# Patient Record
Sex: Male | Born: 1975 | Race: White | Hispanic: No | Marital: Married | Smoking: Never smoker
Health system: Southern US, Community
[De-identification: ages and names within clinical notes are randomized; demographics above are authoritative.]

---

## 2014-08-24 ENCOUNTER — Ambulatory Visit: Payer: Self-pay | Attending: Family Medicine

## 2014-09-09 ENCOUNTER — Emergency Department (HOSPITAL_COMMUNITY)
Admission: EM | Admit: 2014-09-09 | Discharge: 2014-09-09 | Disposition: A | Discharge status: Home / Self Care | Payer: Self-pay | Source: Home / Self Care | Attending: Family Medicine | Admitting: Family Medicine

## 2014-09-09 ENCOUNTER — Encounter (HOSPITAL_COMMUNITY): Payer: Self-pay | Admitting: *Deleted

## 2014-09-09 DIAGNOSIS — K0889 Other specified disorders of teeth and supporting structures: Secondary | ICD-10-CM

## 2014-09-09 DIAGNOSIS — K088 Other specified disorders of teeth and supporting structures: Secondary | ICD-10-CM

## 2014-09-09 MED ORDER — AMOXICILLIN 500 MG PO CAPS: 500.0000 mg | ORAL_CAPSULE | Freq: Three times a day (TID) | ORAL | Status: DC

## 2014-09-09 NOTE — Discharge Instructions (Signed)
Dental Pain °A tooth ache may be caused by cavities (tooth decay). Cavities expose the nerve of the tooth to air and hot or cold temperatures. It may come from an infection or abscess (also called a boil or furuncle) around your tooth. It is also often caused by dental caries (tooth decay). This causes the pain you are having. °DIAGNOSIS  °Your caregiver can diagnose this problem by exam. °TREATMENT  °· If caused by an infection, it may be treated with medications which kill germs (antibiotics) and pain medications as prescribed by your caregiver. Take medications as directed. °· Only take over-the-counter or prescription medicines for pain, discomfort, or fever as directed by your caregiver. °· Whether the tooth ache today is caused by infection or dental disease, you should see your dentist as soon as possible for further care. °SEEK MEDICAL CARE IF: °The exam and treatment you received today has been provided on an emergency basis only. This is not a substitute for complete medical or dental care. If your problem worsens or new problems (symptoms) appear, and you are unable to meet with your dentist, call or return to this location. °SEEK IMMEDIATE MEDICAL CARE IF:  °· You have a fever. °· You develop redness and swelling of your face, jaw, or neck. °· You are unable to open your mouth. °· You have severe pain uncontrolled by pain medicine. °MAKE SURE YOU:  °· Understand these instructions. °· Will watch your condition. °· Will get help right away if you are not doing well or get worse. °Document Released: 02/12/2005 Document Revised: 05/07/2011 Document Reviewed: 10/01/2007 °ExitCare® Patient Information ©2015 [employer]. This information is not intended to replace advice given to you by your health care provider. Make sure you discuss any questions you have with your health care provider. ° °Emergency Department Resource Guide °1) Find a Doctor and Pay Out of Pocket °Although you won't have to find out who  is covered by your insurance plan, it is a good idea to ask around and get recommendations. You will then need to call the office and see if the doctor you have chosen will accept you as a new patient and what types of options they offer for patients who are self-pay. Some doctors offer discounts or will set up payment plans for their patients who do not have insurance, but you will need to ask so you aren't surprised when you get to your appointment. ° °2) Contact Your Local Health Department °Not all health departments have doctors that can see patients for sick visits, but many do, so it is worth a call to see if yours does. If you don't know where your local health department is, you can check in your phone book. The CDC also has a tool to help you locate your state's health department, and many state websites also have listings of all of their local health departments. ° °3) Find a Walk-in Clinic °If your illness is not likely to be very severe or complicated, you may want to try a walk in clinic. These are popping up all over the country in pharmacies, drugstores, and shopping centers. They're usually staffed by nurse practitioners or physician assistants that have been trained to treat common illnesses and complaints. They're usually fairly quick and inexpensive. However, if you have serious medical issues or chronic medical problems, these are probably not your best option. ° °No Primary Care Doctor: °- Call [facility] at  832-8000 - they can help you locate a primary   care doctor that  accepts your insurance, provides certain services, etc. °- Physician Referral Service- 1-800-533-3463 ° °Chronic Pain Problems: °Organization         Address  Phone   Notes  °[facility]  (336) 297-2271 Patients need to be referred by their primary care doctor.  ° °Medication Assistance: °Organization         Address  Phone   Notes  °[place] [facility] [address]  [address] °[place], [place] [address] (336) 641-8030 --Must be a resident of [place] °-- Must have NO insurance coverage whatsoever (no Medicaid/ Medicare, etc.) °-- The pt. MUST have a primary care doctor that directs their care regularly and follows them in the community °  °[facility]  (866) 331-1348   °[employer]  (888) 892-1162   ° °Agencies that provide inexpensive medical care: °Organization         Address  Phone   Notes  °[facility]  (336) 832-8035   °[facility]    (336) 832-7272   °[facility] [address] °[place], [place] [address] (336) 832-4777   °[facility] [address], °[place] (336) 271-4999   °[facility]    (336) 373-0678   °[facility]    (336) 272-1050   °[facility] ° [address], [place] Phone:  (336) 832-4444, Fax:  (336) 832-4440 Hours of Operation:  9 am - 6 pm, M-F.  Also accepts Medicaid/Medicare and self-pay.  °[facility] ° [address], [place] Phone: (336) 832-3150, Fax: (336) 832-3151. Hours of Operation:  8:30 am - 5:30 pm, M-F.  Also accepts Medicaid and self-pay.  °[facility] [place] [address], [place] Phone: (336) 878-6027   °[facility] [address], [place], [place] (336)723-1848, Ext. 123 Mondays & Thursdays: 7-9 AM.  First 15 patients are seen on a first come, first serve basis. °  ° °Medicaid-accepting [place] Providers: ° °Organization         Address  Phone   Notes  °[facility] [address], [place] (336) 641-2100 Also accepts self-pay patients.  °[facility] [address], [place] ° (336) 856-9996   °[facility] [address], [place] (336) 288-8857   °[facility] [address] [place] [address], [place] (336) 299-7000   °Veita Bland [address], [place]  ° (336) 373-1557 Only accepts [place] Access Medicaid patients after they have their name applied to their card.  ° °Self-Pay (no insurance) in [place]: ° °Organization         Address  Phone   Notes  °Sickle Cell Patients, [facility] [address], [place] (336) 832-1970   °[facility] [address], [place] (336) 832-4400   °[facility] ° [address], [place] (336) 992-4800   °[facility]/Dr. Osei-Bonsu ° [address] [place] [address], [place] or [address], [place] (336) 841-8500 Phone number for both [place] and [place] locations is the same.  °[facility] [address], [place] (336) 299-0000   °[facility] [address] [place] [address], [place] or [address] (336) 852-7530 °(336) 878-2260   °  [facility] [address], [place] (336) 350-1642, phone; (336) 294-5005, fax Sees patients 1st and 3rd Saturday of every month.  Must not qualify for public or private insurance (i.e. Medicaid, Medicare, [place] Health Choice, Veterans' Benefits) • Household income should be no more than 200% of the poverty level •The clinic cannot treat you if you are pregnant or think you are pregnant • Sexually transmitted diseases are not treated at the clinic.  ° ° °Dental Care: °Organization         Address  Phone  Notes  °[facility] [address], [place] (336) 641-6152 Accepts children up to age 21 who are enrolled in Medicaid or [place] Health Choice; pregnant women with a Medicaid card; and children who have applied for Medicaid or [place] Health Choice, but were declined, whose parents can pay a reduced fee at time of service.  °[facility] [place]  [address], [place] (336) 641-7733 Accepts children up to age 21 who are  enrolled in Medicaid or [place] Health Choice; pregnant women with a Medicaid card; and children who have applied for Medicaid or [place] Health Choice, but were declined, whose parents can pay a reduced fee at time of service.  °[facility] ° [address], [place] (336) 641-4533 Patients are seen by appointment only. Walk-ins are not accepted. [facility] will see patients 18 years of age and older. °Monday - Tuesday (8am-5pm) °Most Wednesdays (8:30-5pm) °$30 per visit, cash only  °[facility] ° [address], [place] (336) 641-4533 Patients are seen by appointment only. Walk-ins are not accepted. [facility] will see patients 18 years of age and older. °One Wednesday Evening (Monthly: Volunteer Based).  $30 per visit, cash only  °[facility]  (919) 537-3737 for adults; Children under age 4, call Graduate Pediatric Dentistry at (919) [phone]. Children aged 4-14, please call (919) [phone] to request a pediatric application. ° Dental services are provided in all areas of dental care including fillings, crowns and bridges, complete and partial dentures, implants, gum treatment, root canals, and extractions. Preventive care is also provided. Treatment is provided to both adults and children. °Patients are selected via a lottery and there is often a waiting list. °  °[facility] [address], ° ° (336) 763-8833 [email or web address].[email or web address] °  °[facility] [address], [place], [place] (336)723-1848, Ext. 123 Second and Fourth Thursday of each month, opens at 6:30 AM; Clinic ends at 9 AM.  Patients are seen on a first-come first-served basis, and a limited number are seen during each clinic.  ° °[facility] ° [address], [place], [place] (336) 723-7904   Eligibility Requirements °You must have lived in [place], [place], or [place] counties for at least the last three months. °  You  cannot be eligible for state or federal sponsored healthcare insurance, including Veterans Administration, Medicaid, or Medicare. °  You generally cannot be eligible for healthcare insurance through your employer.  °  How to apply: °Eligibility screenings are held every Tuesday and Wednesday afternoon from 1:00 pm until 4:00 pm. You do not need an appointment for the interview!  °[facility] [address], [place], [place] 336-631-2330   °[facility]  336-342-8273   °[facility]  336-703-3100   °[facility]  336-570-6415   ° °Behavioral Health Resources in the Community: °Intensive Outpatient Programs °Organization         Address  Phone  Notes  °[facility] [address], [place], [place] 336-878-6098   °[facility] [address], [place], [place] 336-832-9800   °[facility] [address], [place], [place] ° 336-882-2125   °[facility] [address],  °[place], [place] 1-800-853-5163 or 336-641-4981   °Substance Abuse Resources °Organization         Address  Phone  Notes  °[facility]  336-882-2125   °[facility]  336-784-9470   °[facility]  336-285-9073   °[facility]  336-845-3988   °Residential & Outpatient Substance Abuse Program  1-800-659-3381   °Psychological Services °Organization         Address  Phone  Notes  °[facility]  336- 832-9600   °[facility]  336- 378-7881   °[facility] [address], [place] 1-800-853-5163 or 336-641-4981   ° °Mobile Crisis Teams °Organization         Address  Phone  Notes  °[facility], Mobile Crisis Care Unit  1-877-626-1772   °Assertive °[facility] ° [address] [place], [place] 336-834-9664   °Sharon DeEsch [address] °[place] 336-554-5454   ° °Self-Help/Support  Groups °Organization         Address  Phone             Notes  °[facility] - variety of support groups  336- 373-1402 Call for more information  °Narcotics Anonymous (NA), [facility] [address], °[place]  2 meetings at this location  ° °Residential Treatment Programs °Organization         Address  Phone  Notes  °[facility] [address],    °[place]  1-866-801-8205   °[facility] ° [address], [place], [place] 704-293-8524   °[facility] [address], [place] 336-845-3988 Admissions: 8am-3pm M-F  °[facility] [address]    °[place], [place] 336-841-1104   °[facility] [address], [place], [place] 336-379-7146   °[facility] [address]  °[place], [place] 336-285-9073   °[facility] - Intensive Outpatient [address], [place], [place] 336-852-3033   °[facility] [address]  °[place], [place] 1-877-615-2722 or 336-784-9470   °[facility] [address] [place], [place] 336-227-7417 Accepts Medicaid  °[facility] [address]  °[place] 1-800-659-3381 Substance Abuse/Addiction Treatment  ° °[place] Behavioral Health Resources °Organization         Address  Phone  Notes  °[facility]  (888) 581-9988   °Julie Brannon, PhD [address] [place], [place]   (336) 349-5553 or (336) 951-0000   °[facility]   [address] °[place], [place] (336) 349-4454   °[facility] [address], [place], [place] (336) 342-8316 Insurance/Medicaid/sponsorship through [facility]  °[facility] [address]                                    [place], [place] (336) 342-8316 Therapy/tele-psych/case  °[facility]   [address]  ° [place], [place] (336) 349-2233    °Dr. Arfeen  (336) 349-4544   °[facility] [place]  [place]  [employer] [facility] [place] [facility] 1) [address], [place] °2) [address], [place] °3)  [address], [place] (336) 349-3220 °(336) 342-7768 ° °(336) 342-8140   °[place] [facility] (336) 342-1394 or (336) 342-3537 (After Hours)    ° ° ° °

## 2014-09-09 NOTE — ED Provider Notes (Signed)
CSN: [id number]     Arrival date & time 09/09/14  1303 History   First MD Initiated Contact with Patient 09/09/14 1331     Chief Complaint  Patient presents with  . Oral Swelling   (Consider location/radiation/quality/duration/timing/severity/associated sxs/prior Treatment) Patient is a 39 y.o. male presenting with dental injury. The history is provided by the patient. No language interpreter was used.  Dental Injury This is a new problem. The problem occurs constantly. The problem has not changed since onset.Pertinent negatives include no chest pain. Nothing aggravates the symptoms. Nothing relieves the symptoms. He has tried nothing for the symptoms. The treatment provided no relief.   Pt complains of pain in his teeth History reviewed. No pertinent past medical history. History reviewed. No pertinent past surgical history. History reviewed. No pertinent family history. History  Substance Use Topics  . Smoking status: Never Smoker   . Smokeless tobacco: Not on file  . Alcohol Use: No    Review of Systems  Cardiovascular: Negative for chest pain.  All other systems reviewed and are negative.   Allergies  Review of patient's allergies indicates no known allergies.  Home Medications   Prior to Admission medications   Medication Sig Start Date End Date Taking? Authorizing Provider  amoxicillin (AMOXIL) 500 MG capsule Take 1 capsule (500 mg total) by mouth 3 (three) times daily. 09/09/14   Elson AreasLeslie K Sofia, PA-C   BP 127/81 mmHg  Pulse 89  Temp(Src) 98.2 F (36.8 C)  Resp 16  SpO2 99% Physical Exam  Constitutional: He is oriented to person, place, and time. He appears well-developed and well-nourished.  HENT:  Head: Normocephalic and atraumatic.  Several broken decayed areas, gum swollen around   Eyes: EOM are normal.  Neck: Normal range of motion.  Cardiovascular: Normal rate.   Pulmonary/Chest: Effort normal.  Abdominal: He exhibits no distension.  Musculoskeletal:  Normal range of motion.  Neurological: He is alert and oriented to person, place, and time.  Skin: Skin is warm.  Psychiatric: He has a normal mood and affect.  Nursing note and vitals reviewed.   ED Course  Procedures (including critical care time) Labs Review Labs Reviewed - No data to display  Imaging Review No results found.   MDM   1. Toothache    Resource guide amoxicillian    Elson AreasLeslie K Sofia, PA-C 09/09/14 1400  Lonia SkinnerLeslie K Franklin ParkSofia, [place] JerseyPA-C 09/09/14 1401

## 2014-09-09 NOTE — ED Notes (Signed)
Pt has  Bleeding  From  Gums               As   Well as   What  He    Describes  Is  A  Gap in  His  Teeth         With  The  Onset  Of   Symptoms           For  What  He  Describes  As  A  Long  Time     pt  Has  Not  Seen  A  Dentist  Yet  Due  To  Finances

## 2014-09-20 ENCOUNTER — Ambulatory Visit: Payer: Self-pay

## 2014-10-19 ENCOUNTER — Ambulatory Visit (INDEPENDENT_AMBULATORY_CARE_PROVIDER_SITE_OTHER): Payer: Self-pay | Admitting: Family Medicine

## 2014-10-19 ENCOUNTER — Encounter: Payer: Self-pay | Admitting: Family Medicine

## 2014-10-19 VITALS — BP 113/86 | HR 61 | Temp 98.2°F | Resp 16 | Ht 68.0 in | Wt 177.0 lb

## 2014-10-19 DIAGNOSIS — Z0000 Encounter for general adult medical examination without abnormal findings: Secondary | ICD-10-CM

## 2014-10-19 DIAGNOSIS — K069 Disorder of gingiva and edentulous alveolar ridge, unspecified: Secondary | ICD-10-CM

## 2014-10-19 LAB — CBC WITH DIFFERENTIAL/PLATELET
Basophils Absolute: 0 10*3/uL (ref 0.0–0.1)
Basophils Relative: 0 % (ref 0–1)
Eosinophils Absolute: 0.1 10*3/uL (ref 0.0–0.7)
Eosinophils Relative: 2 % (ref 0–5)
HCT: 46.6 % (ref 39.0–52.0)
Hemoglobin: 15.8 g/dL (ref 13.0–17.0)
Lymphocytes Relative: 37 % (ref 12–46)
Lymphs Abs: 2.2 10*3/uL (ref 0.7–4.0)
MCH: 29.7 pg (ref 26.0–34.0)
MCHC: 33.9 g/dL (ref 30.0–36.0)
MCV: 87.6 fL (ref 78.0–100.0)
MPV: 10 fL (ref 8.6–12.4)
Monocytes Absolute: 0.4 10*3/uL (ref 0.1–1.0)
Monocytes Relative: 7 % (ref 3–12)
Neutro Abs: 3.2 10*3/uL (ref 1.7–7.7)
Neutrophils Relative %: 54 % (ref 43–77)
Platelets: 170 10*3/uL (ref 150–400)
RBC: 5.32 MIL/uL (ref 4.22–5.81)
RDW: 14.4 % (ref 11.5–15.5)
WBC: 6 10*3/uL (ref 4.0–10.5)

## 2014-10-19 LAB — LIPID PANEL
Cholesterol: 170 mg/dL (ref 125–200)
HDL: 31 mg/dL — ABNORMAL LOW (ref >=40)
LDL Cholesterol: 102 mg/dL (ref <130)
Total CHOL/HDL Ratio: 5.5 Ratio — ABNORMAL HIGH (ref <=5.0)
Triglycerides: 183 mg/dL — ABNORMAL HIGH (ref <150)
VLDL: 37 mg/dL — ABNORMAL HIGH (ref <30)

## 2014-10-19 LAB — COMPLETE METABOLIC PANEL WITH GFR
ALT: 20 U/L (ref 9–46)
AST: 20 U/L (ref 10–40)
Albumin: 4.4 g/dL (ref 3.6–5.1)
Alkaline Phosphatase: 65 U/L (ref 40–115)
BUN: 14 mg/dL (ref 7–25)
CO2: 28 mmol/L (ref 20–31)
Calcium: 9.6 mg/dL (ref 8.6–10.3)
Chloride: 103 mmol/L (ref 98–110)
Creat: 0.92 mg/dL (ref 0.60–1.35)
GFR, Est African American: >89 mL/min (ref >=60)
GFR, Est Non African American: >89 mL/min (ref >=60)
Glucose, Bld: 83 mg/dL (ref 65–99)
Potassium: 4.5 mmol/L (ref 3.5–5.3)
Sodium: 137 mmol/L (ref 135–146)
Total Bilirubin: 0.7 mg/dL (ref 0.2–1.2)
Total Protein: 7.1 g/dL (ref 6.1–8.1)

## 2014-10-19 LAB — TSH: TSH: 1.444 u[IU]/mL (ref 0.350–4.500)

## 2014-10-19 MED ORDER — AMOXICILLIN 500 MG PO CAPS: 500.0000 mg | ORAL_CAPSULE | Freq: Three times a day (TID) | ORAL | Status: DC

## 2014-10-19 NOTE — Patient Instructions (Signed)
Someone will call you about your dental appointment. I would advise taking the antibiotic I am prescribing to clear up the infection in your gums.

## 2014-10-19 NOTE — Progress Notes (Signed)
Patient ID: Peter Dorsey, male   DOB: Jul 26, 1975, 39 y.o.   MRN: [id number]   Peter Dorsey, is a 39 y.o. male  [id number]  [id number]  DOB - 01/27/1976  CC: No chief complaint on file.      HPI: Peter Dorsey is a 39 y.o. male here to establish care. His only complaint today is of gum soreness and bleeding. He was in ED recently and was prescribed Amoxicillin but did not fill and take. He is requesting a referral to dentist with his orange card. He denies chronic illneses and takes no regular mediction.  No Known Allergies No past medical history on file. Current Outpatient Prescriptions on File Prior to Visit  Medication Sig Dispense Refill  . amoxicillin (AMOXIL) 500 MG capsule Take 1 capsule (500 mg total) by mouth 3 (three) times daily. 21 capsule 0   No current facility-administered medications on file prior to visit.   No family history on file. Social History   Social History  . Marital Status: Married    Spouse Name: N/A  . Number of Children: N/A  . Years of Education: N/A   Occupational History  . Not on file.   Social History Main Topics  . Smoking status: Never Smoker   . Smokeless tobacco: Not on file  . Alcohol Use: No  . Drug Use: Not on file  . Sexual Activity: Not on file   Other Topics Concern  . Not on file   Social History Narrative  . No narrative on file    Review of Systems: Constitutional: Negative for fever, chills, appetite change, weight loss,  fatigue. HENT: Negative for ear pain, ear discharge.nose bleeds Eyes: Negative for pain, discharge, redness, itching and visual disturbance. Neck: Negative for pain, stiffness Respiratory: Negative for cough, shortness of breath,   Cardiovascular: Negative for chest pain, palpitations and leg swelling. Gastrointestinal: Negative for abdominal distention, abdominal pain, nausea, vomiting, diarrhea, constipations Genitourinary: Negative for dysuria, urgency, frequency, hematuria, flank pain,   Musculoskeletal: Negative for back pain, joint pain, joint  swelling, arthralgia and gait problem.Negative for weakness. Neurological: Negative for dizziness, tremors, seizures, syncope,   light-headedness, numbness and headaches.  Hematological: Negative for easy bruising or bleeding Psychiatric/Behavioral: Negative for depression, anxiety, decreased concentration, confusion    Objective:   Filed Vitals:   10/19/14 0958  BP: 113/86  Pulse: 61  Temp: 98.2 F (36.8 C)  Resp: 16    Physical Exam: Constitutional: Patient appears well-developed and well-nourished. No distress. HENT: Normocephalic, atraumatic, External right and left ear normal. Oropharynx is clear and moist. . Gums on upper right are inflammed and swollen. Dentition is poor. Eyes: Conjunctivae and EOM are normal. PERRLA, no scleral icterus. Neck: Normal ROM. Neck supple. No lymphadenopathy, No thyromegaly. CVS: RRR, S1/S2 +, no murmurs, no gallops, no rubs Pulmonary: Effort and breath sounds normal, no stridor, rhonchi, wheezes, rales.  Abdominal: Soft. Normoactive BS,, no distension, tenderness, rebound or guarding.  Musculoskeletal: Normal range of motion. No edema and no tenderness.  Neuro: Alert.Normal muscle tone coordination. Non-focal Skin: Skin is warm and dry. No rash noted. Not diaphoretic. No erythema. No pallor. Psychiatric: Normal mood and affect. Behavior, judgment, thought content normal.  No results found for: WBC, HGB, HCT, MCV, PLT No results found for: CREATININE, BUN, NA, K, CL, CO2  No results found for: HGBA1C Lipid Panel  No results found for: CHOL, TRIG, HDL, CHOLHDL, VLDL, LDLCALC     Assessment and plan:   Visit to establish care. -I  have reviewed history provided by the patient and ED records -Cmet with GFR, CBC, lipid panel, TSH  Need for tetanus -patient declined  Gum disease -amoxicillin 500 mg, one po tid for 10 days -referral to dentist.  No Follow-up on file.  The  patient was given clear instructions to go to ER or return to medical center if symptoms don't improve, worsen or new problems develop. The patient verbalized understanding.      Henrietta Hoover, MSN, FNP-BC   10/19/2014, 10:02 AM

## 2015-01-24 ENCOUNTER — Ambulatory Visit: Payer: Self-pay | Admitting: Family Medicine

## 2015-02-03 ENCOUNTER — Ambulatory Visit (INDEPENDENT_AMBULATORY_CARE_PROVIDER_SITE_OTHER): Payer: 59 | Admitting: Family Medicine

## 2015-02-03 ENCOUNTER — Encounter: Payer: Self-pay | Admitting: Family Medicine

## 2015-02-03 VITALS — BP 112/77 | HR 68 | Temp 98.3°F | Ht 68.0 in | Wt 170.0 lb

## 2015-02-03 DIAGNOSIS — R0683 Snoring: Secondary | ICD-10-CM | POA: Diagnosis not present

## 2015-02-03 NOTE — Progress Notes (Signed)
Patient ID: Peter Dorsey, male   DOB: Sep 27, 1975, 39 y.o.   MRN: [id number]   Peter Dorsey, is a 39 y.o. male  [id number]  [id number]  DOB - [birth year]  CC:  Chief Complaint  Patient presents with  . Snoring    SOB  . Gum Disease       HPI: Peter Dorsey is a 39 y.o. male here c/o of severe snoring. He has recently tried head elevation to no avail. He also continues to complain of gum disease. He was referred and seen by dentist who says he has significant bone loss. He ask that we check for diabetes. He has been screen with negative results in the last couple of months. He complains of shortness of breath but it is difficult for me to determine if he is having trouble getting air through his nose or into his lungs. I think he is having a problem getting air through the nasal passages.  No Known Allergies History reviewed. No pertinent past medical history. Current Outpatient Prescriptions on File Prior to Visit  Medication Sig Dispense Refill  . amoxicillin (AMOXIL) 500 MG capsule Take 1 capsule (500 mg total) by mouth 3 (three) times daily. (Patient not taking: Reported on 02/03/2015) 30 capsule 0   No current facility-administered medications on file prior to visit.   Family History  Problem Relation Age of Onset  . Diabetes Mother   . Heart disease Father    Social History   Social History  . Marital Status: Married    Spouse Name: N/A  . Number of Children: N/A  . Years of Education: N/A   Occupational History  . Not on file.   Social History Main Topics  . Smoking status: Never Smoker   . Smokeless tobacco: Never Used  . Alcohol Use: No  . Drug Use: No  . Sexual Activity: Not on file   Other Topics Concern  . Not on file   Social History Narrative    Review of Systems: Constitutional: Negative for fever, chills, appetite change, weight loss,  Fatigue. Skin: Negative for rashes or lesions of concern. HENT: Negative for ear pain, ear discharge.nose  bleeds. Positive for nasal congestion. Eyes: Negative for pain, discharge, redness, itching and visual disturbance. Neck: Negative for pain, stiffness Respiratory: Negative for cough. Complains of shortness of breath and snoring. Cardiovascular: Negative for chest pain, palpitations and leg swelling. Gastrointestinal: Negative for abdominal pain, nausea, vomiting, diarrhea, constipations Genitourinary: Negative for dysuria, urgency, frequency, hematuria,  Musculoskeletal: Negative for back pain, joint pain, joint  swelling, and gait problem.Negative for weakness. Neurological: Negative for dizziness, tremors, seizures, syncope,   light-headedness, numbness and headaches.  Hematological: Negative for easy bruising or bleeding Psychiatric/Behavioral: Positive for mild   depression, anxiety.  Objective:   Filed Vitals:   02/03/15 1328  BP: 112/77  Pulse: 68  Temp: 98.3 F (36.8 C)    Physical Exam: Constitutional: Patient appears well-developed and well-nourished. No distress. HENT: Normocephalic, atraumatic, External right and left ear normal. Oropharynx is clear and moist.  Eyes: Conjunctivae and EOM are normal. PERRLA, no scleral icterus. Neck: Normal ROM. Neck supple. No lymphadenopathy, No thyromegaly. CVS: RRR, S1/S2 +, no murmurs, no gallops, no rubs Pulmonary: Effort and breath sounds normal, no stridor, rhonchi, wheezes, rales.  Abdominal: Soft. Normoactive BS,, no distension, tenderness, rebound or guarding.  Musculoskeletal: Normal range of motion. No edema and no tenderness.  Neuro: Alert.Normal muscle tone coordination. Non-focal Skin: Skin is warm and dry. No rash noted.  Not diaphoretic. No erythema. No pallor. Psychiatric: Normal mood and affect. Behavior, judgment, thought content normal.  Lab Results  Component Value Date   WBC 6.0 10/19/2014   HGB 15.8 10/19/2014   HCT 46.6 10/19/2014   MCV 87.6 10/19/2014   PLT 170 10/19/2014   Lab Results  Component Value  Date   CREATININE 0.92 10/19/2014   BUN 14 10/19/2014   NA 137 10/19/2014   K 4.5 10/19/2014   CL 103 10/19/2014   CO2 28 10/19/2014    No results found for: HGBA1C Lipid Panel     Component Value Date/Time   CHOL 170 10/19/2014 1026   TRIG 183* 10/19/2014 1026   HDL 31* 10/19/2014 1026   CHOLHDL 5.5* 10/19/2014 1026   VLDL 37* 10/19/2014 1026   LDLCALC 102 10/19/2014 1026       Assessment and plan:   1. Snoring Referral for sleep study.   No Follow-up on file.  The patient was given clear instructions to go to ER or return to medical center if symptoms don't improve, worsen or new problems develop. The patient verbalized understanding.    Henrietta HooverLinda C Nasier Thumm FNP  02/03/2015, 2:43 PM

## 2015-02-03 NOTE — Patient Instructions (Signed)
We are putting in a referral for a sleep study. Someone will call you Your blood work showed no indication of diabetes

## 2016-04-17 ENCOUNTER — Ambulatory Visit
Admission: RE | Admit: 2016-04-17 | Discharge: 2016-04-17 | Disposition: A | Discharge status: Home / Self Care | Payer: BLUE CROSS/BLUE SHIELD | Source: Ambulatory Visit | Attending: Cardiovascular Disease | Admitting: Cardiovascular Disease

## 2016-04-17 ENCOUNTER — Other Ambulatory Visit: Payer: Self-pay | Admitting: Cardiovascular Disease

## 2016-04-17 DIAGNOSIS — R0602 Shortness of breath: Secondary | ICD-10-CM

## 2016-05-17 ENCOUNTER — Encounter: Payer: Self-pay | Admitting: Internal Medicine

## 2016-05-30 ENCOUNTER — Ambulatory Visit (INDEPENDENT_AMBULATORY_CARE_PROVIDER_SITE_OTHER): Payer: BLUE CROSS/BLUE SHIELD | Admitting: Internal Medicine

## 2016-05-30 ENCOUNTER — Encounter: Payer: Self-pay | Admitting: Internal Medicine

## 2016-05-30 DIAGNOSIS — F5102 Adjustment insomnia: Secondary | ICD-10-CM

## 2016-05-30 MED ORDER — ZOLPIDEM TARTRATE 5 MG PO TABS: ORAL_TABLET | ORAL | 5 refills | Status: AC

## 2016-05-30 MED ORDER — ZOLPIDEM TARTRATE 5 MG PO TABS: ORAL_TABLET | ORAL | 5 refills | Status: DC

## 2016-05-30 NOTE — Progress Notes (Signed)
05/30/2016-41 year old male never smoker from [place]. Self referral-never had sleep study. Does not sleep through the night and takes a while to fall alseep. Has tried Melatonin for sleep. Never tried any other OTC meds or Rx for sleep. He is in school for 3 more months. His wife and children have gone back to [place] and he won't be able to join them until he finishes school, probably sometime this fall. He is indicating that sleep problems began with the separation. It is taking at least an hour now to fall asleep and then after 3 or 4 hours he wakes spontaneously. Not sleeping in the daytime. Bedtime anywhere between 10:30 and midnight and then wakes anywhere between 3:30 and 5 AM. He has lost some weight. Previously told that he does snore. Sometimes lies awake feeling restless but denies daytime nervousness, palpitation, sweat or history of thyroid problems. Breathing is comfortable. Tried melatonin a couple of times but did not recognize benefit.  Prior to Admission medications   Medication Sig Start Date End Date Taking? Authorizing Provider  zolpidem (AMBIEN) 5 MG tablet 1 or 2 tabs for sleep if needed 05/30/16   Waymon Budge, MD   No past medical history on file. No past surgical history on file. Family History  Problem Relation Age of Onset  . Diabetes Mother   . Heart disease Father    Social History   Social History  . Marital status: Married    Spouse name: N/A  . Number of children: N/A  . Years of education: N/A   Occupational History  . Not on file.   Social History Main Topics  . Smoking status: Never Smoker  . Smokeless tobacco: Never Used  . Alcohol use No  . Drug use: No  . Sexual activity: Not on file   Other Topics Concern  . Not on file   Social History Narrative  . No narrative on file   ROS-see HPI   Negative unless "+" Constitutional:    weight loss, night sweats, fevers, chills, fatigue, lassitude. HEENT:    headaches, difficulty swallowing,  tooth/dental problems, sore throat,       sneezing, itching, ear ache, nasal congestion, post nasal drip, snoring CV:    chest pain, orthopnea, PND, swelling in lower extremities, anasarca,                                                         dizziness, palpitations Resp:   shortness of breath with exertion or at rest.                productive cough,   non-productive cough, coughing up of blood.              change in color of mucus.  wheezing.   Skin:    rash or lesions. GI:  No-   heartburn, indigestion, abdominal pain, nausea, vomiting, diarrhea,                 change in bowel habits, loss of appetite GU: dysuria, change in color of urine, no urgency or frequency.   flank pain. MS:   joint pain, stiffness, decreased range of motion, back pain. Neuro-     nothing unusual Psych:  change in mood or affect.  depression or +anxiety.   memory loss.  OBJ- Physical Exam General- Alert, Oriented, Affect-appropriate, Distress- none acute, + Very slender Skin- rash-none, lesions- none, excoriation- none Lymphadenopathy- none Head- atraumatic            Eyes- Gross vision intact, PERRLA, conjunctivae and secretions clear            Ears- Hearing, canals-normal            Nose- Clear, no-Septal dev, mucus, polyps, erosion, perforation             Throat- Mallampati III , mucosa clear , drainage- none, tonsils- atrophic Neck- flexible , trachea midline, no stridor , thyroid nl, carotid no bruit Chest - symmetrical excursion , unlabored           Heart/CV- RRR , no murmur , no gallop  , no rub, nl s1 s2                           - JVD- none , edema- none, stasis changes- none, varices- none           Lung- clear to P&A, wheeze- none, cough- none , dullness-none, rub- none           Chest wall-  Abd-  Br/ Gen/ Rectal- Not done, not indicated Extrem- cyanosis- none, clubbing, none, atrophy- none, strength- nl Neuro- grossly intact to observation

## 2016-05-30 NOTE — Patient Instructions (Signed)
Script for [employer] 5 mg to help with insomnia.   Try taking it about 15 minutes before bedtime if needed for sleep  Please call as needed

## 2016-05-31 DIAGNOSIS — F5102 Adjustment insomnia: Secondary | ICD-10-CM | POA: Insufficient documentation

## 2016-05-31 NOTE — Assessment & Plan Note (Signed)
He probably snores some. Especially with his weight loss and slender body build, I don't have much reason to suspect significant sleep apnea. This seems much more consistent with insomnia of stress related to separation from family who have gone back to [place]. We discussed basic good sleep hygiene. I think it is appropriate short-term to give him a sleep aid and I have discussed Ambien. Plan-sleep hygiene. Ambien. Consider a sleep study later if circumstances change.

## 2016-06-08 ENCOUNTER — Telehealth: Payer: Self-pay | Admitting: Internal Medicine

## 2016-06-08 NOTE — Telephone Encounter (Signed)
CY  Please Advise-   Pt states he has tried the Ambien but it is not working well. He is not able to stay asleep while using it. He states you mentioned changing him to something else. Pt is aware that you are out of the office until 06/11/16 and he states he can wait till you come back in to find out what you suggest.   No Known Allergies

## 2016-06-11 MED ORDER — ESZOPICLONE 3 MG PO TABS: 3.0000 mg | ORAL_TABLET | Freq: Every evening | ORAL | 0 refills | Status: AC | PRN

## 2016-06-11 NOTE — Telephone Encounter (Signed)
Pt returning call.Peter Dorsey ° °

## 2016-06-11 NOTE — Telephone Encounter (Signed)
Pt aware that we are calling in Lunesta to his pharmacy. Nothing further needed.

## 2016-06-11 NOTE — Telephone Encounter (Signed)
[phone] pt calling back but he is going to class right now

## 2016-06-11 NOTE — Telephone Encounter (Signed)
lmomtcb x1 for pt 

## 2016-06-11 NOTE — Telephone Encounter (Signed)
Offer lunesta 3 mg, #  15,   1 at bedtime

## 2017-06-24 IMAGING — CR DG CHEST 2V
2 series · 2 of 2 positions shown · non-contrast
Comparison: None.

CLINICAL DATA: Shortness of breath for 6 months.

EXAM:
CHEST  2 VIEW

[w chest pa]
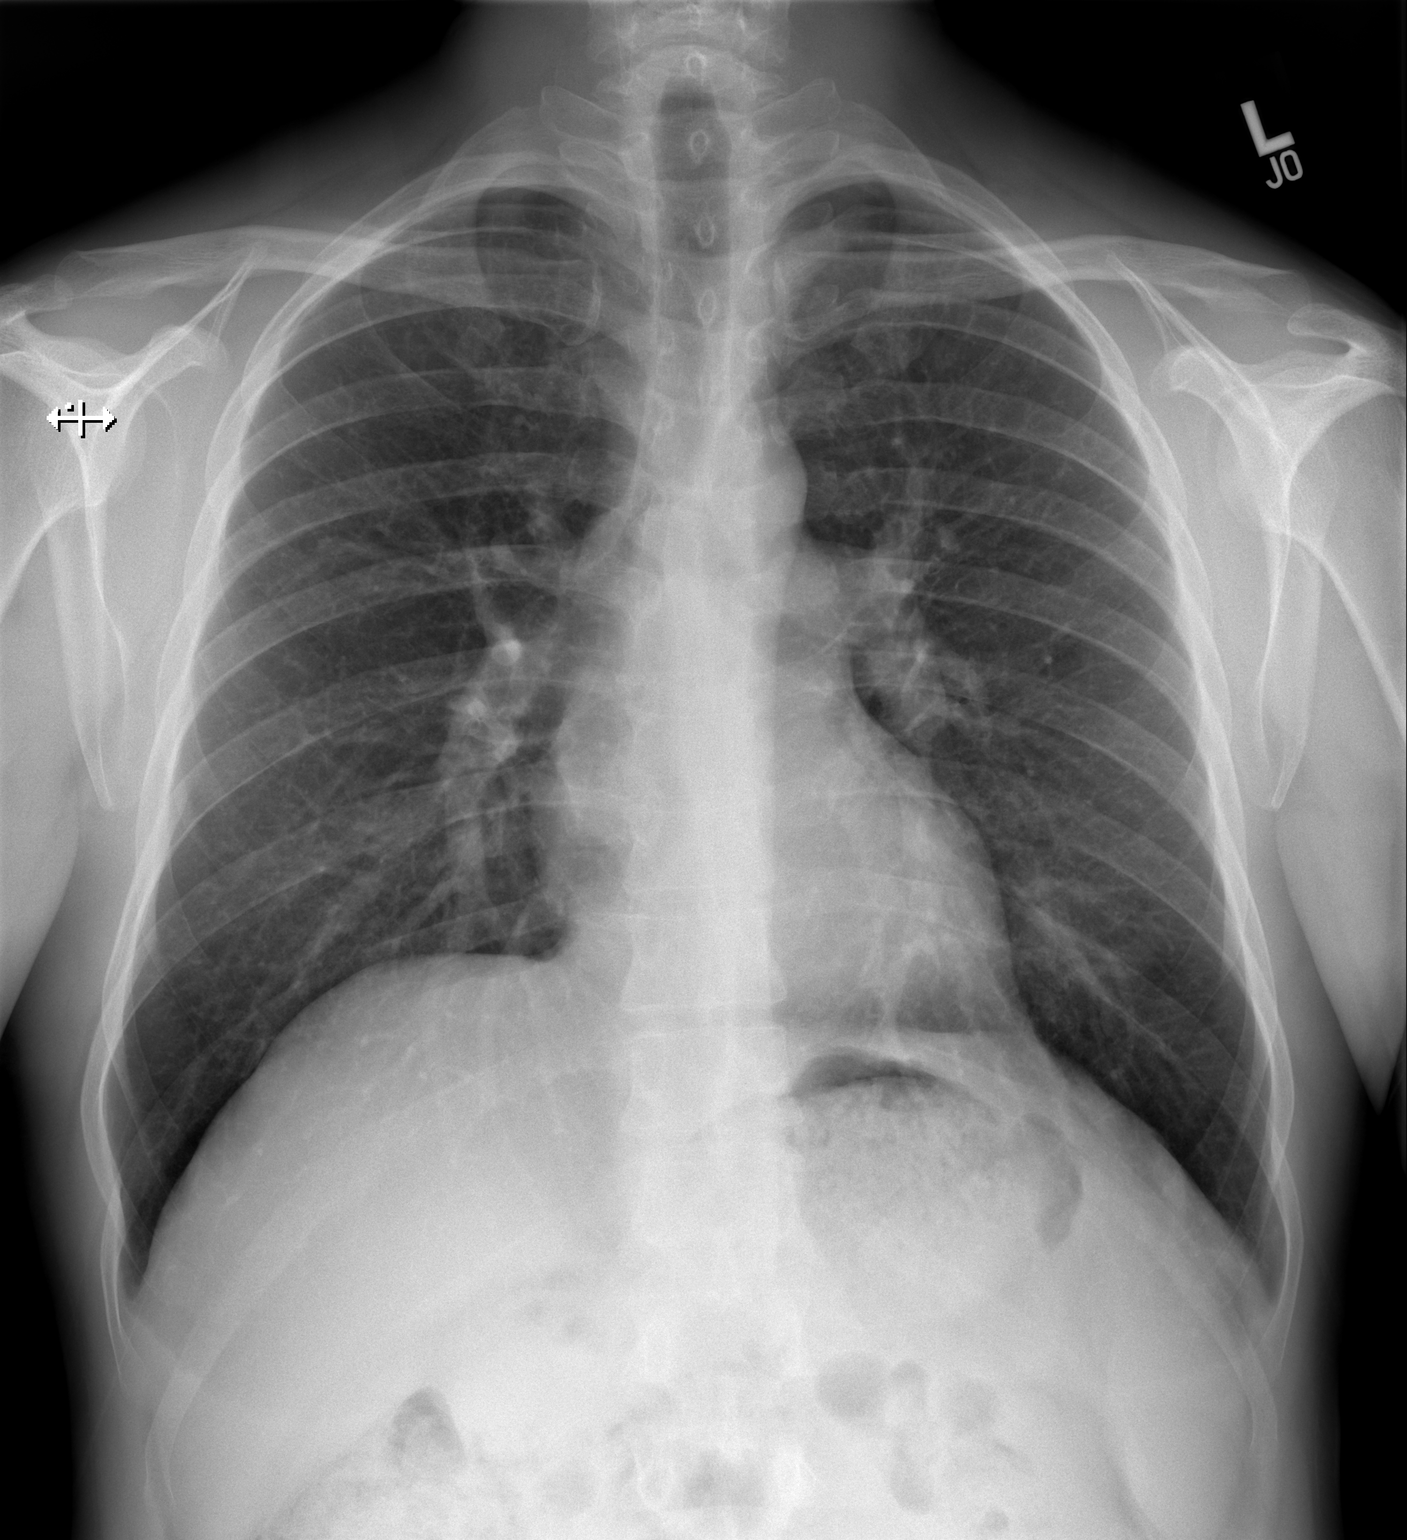

[w chest lat]
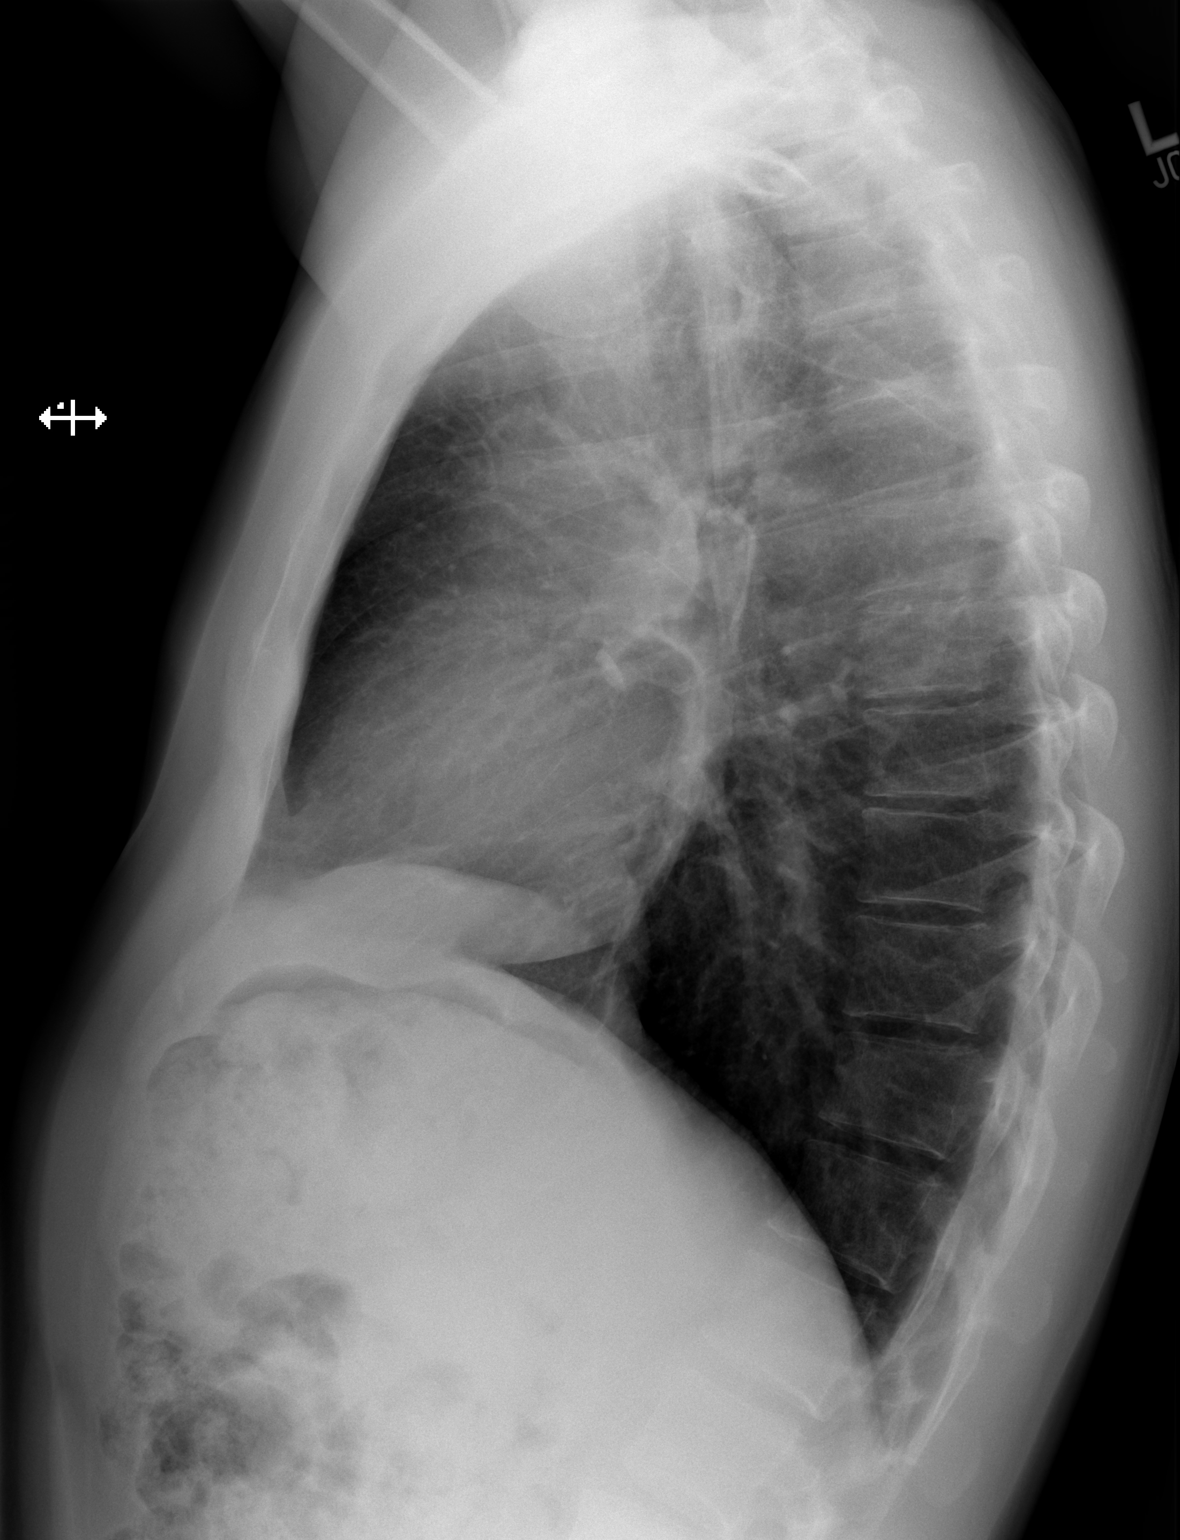

[2 of 2 positions shown; findings below may reference images not displayed]

FINDINGS: The heart size and mediastinal contours are within normal limits.
Both lungs are clear. The visualized skeletal structures are
unremarkable.
IMPRESSION: No active cardiopulmonary disease.
